# Patient Record
Sex: Male | Born: 1995 | Race: White | Hispanic: No | Smoking: Never smoker
Health system: Southern US, Community
[De-identification: ages and names within clinical notes are randomized; demographics above are authoritative.]

## PROBLEM LIST (undated history)

## (undated) HISTORY — PX: EXTERNAL EAR SURGERY: SHX627

## (undated) HISTORY — PX: FOOT SURGERY: SHX648

---
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
## 2017-12-15 ENCOUNTER — Encounter: Payer: Self-pay | Admitting: Emergency Medicine

## 2017-12-15 ENCOUNTER — Other Ambulatory Visit: Payer: Self-pay

## 2017-12-15 ENCOUNTER — Emergency Department
Admission: EM | Admit: 2017-12-15 | Discharge: 2017-12-15 | Disposition: A | Discharge status: Home / Self Care | Payer: Managed Care, Other (non HMO) | Attending: Emergency Medicine | Admitting: Emergency Medicine

## 2017-12-15 DIAGNOSIS — Y93G1 Activity, food preparation and clean up: Secondary | ICD-10-CM | POA: Insufficient documentation

## 2017-12-15 DIAGNOSIS — Y998 Other external cause status: Secondary | ICD-10-CM | POA: Diagnosis not present

## 2017-12-15 DIAGNOSIS — Y929 Unspecified place or not applicable: Secondary | ICD-10-CM | POA: Insufficient documentation

## 2017-12-15 DIAGNOSIS — S61012A Laceration without foreign body of left thumb without damage to nail, initial encounter: Secondary | ICD-10-CM

## 2017-12-15 DIAGNOSIS — W260XXA Contact with knife, initial encounter: Secondary | ICD-10-CM | POA: Diagnosis not present

## 2017-12-15 DIAGNOSIS — S61011A Laceration without foreign body of right thumb without damage to nail, initial encounter: Secondary | ICD-10-CM | POA: Diagnosis present

## 2017-12-15 NOTE — ED Notes (Addendum)
Pt presents to ED with c/o right thumb laceration that happened approx 2 hrs PTA while cutting vegetables. No bleeding noted upon arrival. Pt states last tetanus within a year.

## 2017-12-15 NOTE — ED Notes (Signed)
Allayne StackJann, EDT to apply right hand splint

## 2017-12-15 NOTE — ED Provider Notes (Signed)
[facility] Emergency Department Provider Note  ____________________________________________  Time seen: Approximately 9:51 PM  I have reviewed the triage vital signs and the nursing notes.   HISTORY  Chief Complaint Laceration    HPI Ronald Freeman is a 22 y.o. male presents to the emergency department with a right thumb laceration.  Patient reports that he sustained laceration with a clean knife while cutting vegetables.  Tetanus status is up-to-date.  He denies weakness, radiculopathy or changes in sensation of the upper extremities.  No alleviating measures of been attempted.   History reviewed. No pertinent past medical history.  There are no active problems to display for this patient.   Past Surgical History:  Procedure Laterality Date  . EXTERNAL EAR SURGERY Left    tumor removal  . FOOT SURGERY Right     Prior to Admission medications   Medication Sig Start Date End Date Taking? Authorizing Provider  cetirizine (ZYRTEC) 10 MG tablet Take 10 mg by mouth as needed for allergies.    [provider]  fexofenadine (ALLEGRA) 180 MG tablet Take 180 mg by mouth as needed for allergies or rhinitis.    [provider]  fluticasone (FLONASE) 50 MCG/ACT nasal spray Place into both nostrils as needed for allergies or rhinitis.    [provider]  pseudoephedrine (SUDAFED) 120 MG 12 hr tablet Take 120 mg by mouth daily as needed for congestion.    [provider]    Allergies Patient has no known allergies.  No family history on file.  Social History Social History   Tobacco Use  . Smoking status: Never Smoker  . Smokeless tobacco: Never Used  Substance Use Topics  . Alcohol use: No  . Drug use: Not on file     Review of Systems  Constitutional: No fever/chills Eyes: No visual changes. No discharge ENT: No upper respiratory complaints. Cardiovascular: no chest pain. Respiratory: no cough. No  SOB. Gastrointestinal: No abdominal pain.  No nausea, no vomiting.  No diarrhea.  No constipation. Musculoskeletal: Negative for musculoskeletal pain. Skin: Patient has laceration of right thumb. Neurological: Negative for headaches, focal weakness or numbness. .  ____________________________________________   PHYSICAL EXAM:  VITAL SIGNS: ED Triage Vitals  Enc Vitals Group     BP 12/15/17 1933 125/78     Pulse Rate 12/15/17 1933 76     Resp 12/15/17 1933 18     Temp 12/15/17 1933 98 F (36.7 C)     Temp Source 12/15/17 1933 Oral     SpO2 12/15/17 1933 97 %     Weight 12/15/17 1931 265 lb (120.2 kg)     Height 12/15/17 1931 5\' 8"  (1.727 m)     Head Circumference --      Peak Flow --      Pain Score 12/15/17 1931 0     Pain Loc --      Pain Edu? --      Excl. in GC? --      Constitutional: Alert and oriented. Well appearing and in no acute distress. Eyes: Conjunctivae are normal. PERRL. EOMI. Head: Atraumatic. Cardiovascular: Normal rate, regular rhythm. Normal S1 and S2.  Good peripheral circulation. Respiratory: Normal respiratory effort without tachypnea or retractions. Lungs CTAB. Good air entry to the bases with no decreased or absent breath sounds. Musculoskeletal: Full range of motion to all extremities. No gross deformities appreciated. Neurologic:  Normal speech and language. No gross focal neurologic deficits are appreciated.  Skin: Patient  has 1 cm laceration sustained to her right thumb. Psychiatric: Mood and affect are normal. Speech and behavior are normal. Patient exhibits appropriate insight and judgement.   ____________________________________________   LABS (all labs ordered are listed, but only abnormal results are displayed)  Labs Reviewed - No data to display ____________________________________________  EKG   ____________________________________________  RADIOLOGY   No results  found.  ____________________________________________    PROCEDURES  Procedure(s) performed:    Procedures  LACERATION REPAIR Performed by: Orvil Feil Authorized by: Orvil Feil Consent: Verbal consent obtained. Risks and benefits: risks, benefits and alternatives were discussed Consent given by: patient Patient identity confirmed: provided demographic data Prepped and Draped in normal sterile fashion Wound explored  Laceration Location: Right thumb  Laceration Length: 1 cm  No Foreign Bodies seen or palpated  Skin closure: Dermabond  Patient tolerance: Patient tolerated the procedure well with no immediate complications.   Medications - No data to display   ____________________________________________   INITIAL IMPRESSION / ASSESSMENT AND PLAN / ED COURSE  Pertinent labs & imaging results that were available during my care of the patient were reviewed by me and considered in my medical decision making (see chart for details).  Review of the [place] CSRS was performed in accordance of the [facility] prior to dispensing any controlled drugs.     Assessment and plan Right thumb laceration Patient presents to the emergency department with a laceration along the right thumb repaired in the emergency department with Dermabond.  Patient was advised to follow-up with primary care as needed.  All patient questions were answered.    ____________________________________________  FINAL CLINICAL IMPRESSION(S) / ED DIAGNOSES  Final diagnoses:  Laceration of left thumb without foreign body without damage to nail, initial encounter      NEW MEDICATIONS STARTED DURING THIS VISIT:  ED Discharge Orders    None          This chart was dictated using voice recognition software/Dragon. Despite best efforts to proofread, errors can occur which can change the meaning. Any change was purely unintentional.    Orvil Feil, PA-C 12/15/17 2154    Jeanmarie Plant, MD 12/15/17 [phone]

## 2017-12-15 NOTE — ED Triage Notes (Addendum)
Patient ambulatory to triage with steady gait, without difficulty or distress noted; pt reports cutting right thumb 2hrs PTA while cutting produce with a knife; approx 1" lac noted with scant bleeding

## 2018-01-21 ENCOUNTER — Emergency Department
Admission: EM | Admit: 2018-01-21 | Discharge: 2018-01-21 | Disposition: A | Discharge status: Home / Self Care | Payer: Managed Care, Other (non HMO) | Attending: Emergency Medicine | Admitting: Emergency Medicine

## 2018-01-21 ENCOUNTER — Encounter: Payer: Self-pay | Admitting: Emergency Medicine

## 2018-01-21 DIAGNOSIS — H538 Other visual disturbances: Secondary | ICD-10-CM | POA: Insufficient documentation

## 2018-01-21 DIAGNOSIS — H81392 Other peripheral vertigo, left ear: Secondary | ICD-10-CM | POA: Insufficient documentation

## 2018-01-21 DIAGNOSIS — R42 Dizziness and giddiness: Secondary | ICD-10-CM | POA: Diagnosis not present

## 2018-01-21 DIAGNOSIS — R112 Nausea with vomiting, unspecified: Secondary | ICD-10-CM | POA: Insufficient documentation

## 2018-01-21 DIAGNOSIS — R51 Headache: Secondary | ICD-10-CM | POA: Diagnosis present

## 2018-01-21 LAB — URINALYSIS, COMPLETE (UACMP) WITH MICROSCOPIC
BACTERIA UA: NONE SEEN
Bilirubin Urine: NEGATIVE
GLUCOSE, UA: NEGATIVE mg/dL
HGB URINE DIPSTICK: NEGATIVE
KETONES UR: NEGATIVE mg/dL
LEUKOCYTES UA: NEGATIVE
NITRITE: NEGATIVE
PROTEIN: NEGATIVE mg/dL
Specific Gravity, Urine: 1.026 (ref 1.005–1.030)
pH: 6 (ref 5.0–8.0)

## 2018-01-21 LAB — BASIC METABOLIC PANEL
ANION GAP: 8 (ref 5–15)
BUN: 14 mg/dL (ref 6–20)
CO2: 23 mmol/L (ref 22–32)
Calcium: 9.1 mg/dL (ref 8.9–10.3)
Chloride: 107 mmol/L (ref 101–111)
Creatinine, Ser: 0.77 mg/dL (ref 0.61–1.24)
Glucose, Bld: 97 mg/dL (ref 65–99)
Potassium: 4.1 mmol/L (ref 3.5–5.1)
SODIUM: 138 mmol/L (ref 135–145)

## 2018-01-21 LAB — CBC
HEMATOCRIT: 47.7 % (ref 40.0–52.0)
HEMOGLOBIN: 16.5 g/dL (ref 13.0–18.0)
MCH: 29.8 pg (ref 26.0–34.0)
MCHC: 34.6 g/dL (ref 32.0–36.0)
MCV: 86.2 fL (ref 80.0–100.0)
Platelets: 254 10*3/uL (ref 150–440)
RBC: 5.54 MIL/uL (ref 4.40–5.90)
RDW: 12.5 % (ref 11.5–14.5)
WBC: 8 10*3/uL (ref 3.8–10.6)

## 2018-01-21 MED ORDER — MECLIZINE HCL 25 MG PO TABS: 25.0000 mg | ORAL_TABLET | Freq: Three times a day (TID) | ORAL | 0 refills | Status: AC | PRN

## 2018-01-21 MED ORDER — MECLIZINE HCL 25 MG PO TABS
50.0000 mg | ORAL_TABLET | Freq: Once | ORAL | Status: AC
  Administered 2018-01-21: 50 mg via ORAL
  Filled 2018-01-21: qty 2

## 2018-01-21 MED ORDER — ONDANSETRON 4 MG PO TBDP: 4.0000 mg | ORAL_TABLET | Freq: Three times a day (TID) | ORAL | 0 refills | Status: AC | PRN

## 2018-01-21 MED ORDER — ONDANSETRON 4 MG PO TBDP
4.0000 mg | ORAL_TABLET | Freq: Once | ORAL | Status: AC
  Administered 2018-01-21: 4 mg via ORAL
  Filled 2018-01-21: qty 1

## 2018-01-21 NOTE — ED Triage Notes (Signed)
FIRST NURSE NOTE-nausea and vomiting with dizziness. From [facility].  Has not kept anything down for 14 hrs per pt.  Alert, in wheelchair. NAD.

## 2018-01-21 NOTE — ED Triage Notes (Signed)
Patient presents to the ED with headache, nausea and vomiting since patient woke up this morning.  Patient states as soon as a sat up in bed I started feeling terrible.  Patient states, "the floor is spinning."  Patient states dizziness is much worse when he closes his eyes.  Patient denies history of vertigo.

## 2018-01-21 NOTE — ED Notes (Signed)
Patient given a couple of packets of saltines per mother's request. Patient has tolerated sips of Mountain Dew at this time.

## 2018-01-21 NOTE — ED Provider Notes (Signed)
[facility] Emergency Department Provider Note  ____________________________________________   First MD Initiated Contact with Patient 01/21/18 1633     (approximate)  I have reviewed the triage vital signs and the nursing notes.   HISTORY  Chief Complaint Emesis and Dizziness   HPI Ronald Freeman is a 22 y.o. male who self presents to the emergency department from [facility] with headache, nausea, vomiting, and vertigo that began this morning.  Symptoms began when he woke up and rolled over in bed and were severe.  He said "the floor is spinning".  His symptoms are worse when moving and turning his head and improved when lying still.  Associated with nausea and blurred vision.  This is never happened before.  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  He denies chest pain, shortness of breath, numbness, weakness, slurred speech.  He has no recent illness.  History reviewed. No pertinent past medical history.  There are no active problems to display for this patient.   Past Surgical History:  Procedure Laterality Date  . EXTERNAL EAR SURGERY Left    tumor removal  . FOOT SURGERY Right     Prior to Admission medications   Medication Sig Start Date End Date Taking? Authorizing Provider  cetirizine (ZYRTEC) 10 MG tablet Take 10 mg by mouth as needed for allergies.    [provider]  fexofenadine (ALLEGRA) 180 MG tablet Take 180 mg by mouth as needed for allergies or rhinitis.    [provider]  fluticasone (FLONASE) 50 MCG/ACT nasal spray Place into both nostrils as needed for allergies or rhinitis.    [provider]  meclizine (ANTIVERT) 25 MG tablet Take 1 tablet (25 mg total) by mouth 3 (three) times daily as needed for dizziness. 01/21/18   Merrily Brittle, MD  ondansetron (ZOFRAN ODT) 4 MG disintegrating tablet Take 1 tablet (4 mg total) by mouth every 8  (eight) hours as needed for nausea or vomiting. 01/21/18   Merrily Brittle, MD  pseudoephedrine (SUDAFED) 120 MG 12 hr tablet Take 120 mg by mouth daily as needed for congestion.    [provider]    Allergies Patient has no known allergies.  No family history on file.  Social History Social History   Tobacco Use  . Smoking status: Never Smoker  . Smokeless tobacco: Never Used  Substance Use Topics  . Alcohol use: Yes    Comment: 1 drink/ month  . Drug use: Not on file    Review of Systems Constitutional: No fever/chills Eyes: Positive for ENT: No sore throat. Cardiovascular: Denies chest pain. Respiratory: Denies shortness of breath. Gastrointestinal: No abdominal pain.  Positive for nausea, positive for vomiting.  No diarrhea.  No constipation. Genitourinary: Negative for dysuria. Musculoskeletal: Negative for back pain. Skin: Negative for bruise. Neurological: Positive for headache   ____________________________________________   PHYSICAL EXAM:  VITAL SIGNS: ED Triage Vitals  Enc Vitals Group     BP 01/21/18 1326 108/79     Pulse Rate 01/21/18 1326 67     Resp 01/21/18 1326 18     Temp 01/21/18 1326 98.2 F (36.8 C)     Temp Source 01/21/18 1326 Oral     SpO2 01/21/18 1326 98 %     Weight 01/21/18 1326 265 lb (120.2 kg)     Height 01/21/18 1326  (1.753 m)     Head Circumference --  Peak Flow --      Pain Score 01/21/18 1350 0     Pain Loc --      Pain Edu? --      Excl. in GC? --     Constitutional: Alert and oriented x4 appears somewhat uncomfortable nontoxic no diaphoresis speaks in full clear sentences Eyes: PERRL EOMI. nystagmus on leftward gaze fatigable.  No vertical or rotational nystagmus Head: Atraumatic.  Right tympanic membrane normal left tympanic membrane normal Nose: No congestion/rhinnorhea. Mouth/Throat: No trismus Neck: No stridor.   Cardiovascular: Normal rate, regular rhythm. Grossly normal heart sounds.  Good  peripheral circulation. Respiratory: Normal respiratory effort.  No retractions. Lungs CTAB and moving good air Gastrointestinal: Soft nontender Musculoskeletal: No lower extremity edema   Neurologic:  Normal speech and language. N No pronator drift.  Normal finger-nose-finger.  No truncal ataxia Skin:  Skin is warm, dry and intact. No rash noted. Psychiatric: Mood and affect are normal. Speech and behavior are normal.    ____________________________________________   DIFFERENTIAL includes but not limited to  Central vertigo, peripheral vertigo, otitis media ____________________________________________   LABS (all labs ordered are listed, but only abnormal results are displayed)  Labs Reviewed  URINALYSIS, COMPLETE (UACMP) WITH MICROSCOPIC - Abnormal; Notable for the following components:      Result Value   Color, Urine YELLOW (*)    APPearance HAZY (*)    All other components within normal limits  BASIC METABOLIC PANEL  CBC    Lab work reviewed by me with no acute disease __________________________________________  EKG  ED ECG REPORT I, Merrily Brittle, the attending physician, personally viewed and interpreted this ECG.  Date: 01/21/2018 EKG Time:  Rate: 67 Rhythm: normal sinus rhythm QRS Axis: normal Intervals: normal ST/T Wave abnormalities: normal Narrative Interpretation: no evidence of acute ischemia  ____________________________________________  RADIOLOGY   ____________________________________________   PROCEDURES  Procedure(s) performed: no  Procedures  Critical Care performed: no  Observation: no ____________________________________________   INITIAL IMPRESSION / ASSESSMENT AND PLAN / ED COURSE  Pertinent labs & imaging results that were available during my care of the patient were reviewed by me and considered in my medical decision making (see chart for details).  By the time I saw the patient he had already meclizine and Zofran and  his symptoms were significantly improved.  He has brief discrete episodes of room spinning vertigo associated with movement which are most consistent with BPPV.  He does have fatigable leftward beating nystagmus on exam.  No other neuro symptoms to suggest central etiology.  I demonstrated the Epley maneuver to the patient with minimal improvement I have encouraged him to continue at home.  He does follow-up with Dr. 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  He is discharged home in improved condition verbalizes understanding agreement the plan.      ____________________________________________   FINAL CLINICAL IMPRESSION(S) / ED DIAGNOSES  Final diagnoses:  Peripheral vertigo involving left ear      NEW MEDICATIONS STARTED DURING THIS VISIT:  Discharge Medication List as of 01/21/2018  4:54 PM    START taking these medications   Details  meclizine (ANTIVERT) 25 MG tablet Take 1 tablet (25 mg total) by mouth 3 (three) times daily as needed for dizziness., Starting Sat 01/21/2018, Print    ondansetron (ZOFRAN ODT) 4 MG disintegrating tablet Take 1 tablet (4 mg total) by mouth every 8 (eight) hours as needed  for nausea or vomiting., Starting Sat 01/21/2018, Print         Note:  This document was prepared using Dragon voice recognition software and may include unintentional dictation errors.     Merrily Brittle, MD 01/22/18 1354

## 2018-01-21 NOTE — Discharge Instructions (Signed)
Please perform the Epley maneuver 5-10 times a day towards the left until you feel improved.  Follow-up with Dr. Jenne Campus if your symptoms do not resolve in the next day or 2.  Return to the emergency department sooner for any concerns whatsoever.  It was a pleasure to take care of you today, and thank you for coming to our emergency department.  If you have any questions or concerns before leaving please ask the nurse to grab me and I'm more than happy to go through your aftercare instructions again.  If you were prescribed any opioid pain medication today such as Norco, Vicodin, Percocet, morphine, hydrocodone, or oxycodone please make sure you do not drive when you are taking this medication as it can alter your ability to drive safely.  If you have any concerns once you are home that you are not improving or are in fact getting worse before you can make it to your follow-up appointment, please do not hesitate to call [phone] and come back for further evaluation.  Merrily Brittle, MD  Results for orders placed or performed during the hospital encounter of 01/21/18  Basic metabolic panel  Result Value Ref Range   Sodium 138 135 - 145 mmol/L   Potassium 4.1 3.5 - 5.1 mmol/L   Chloride 107 101 - 111 mmol/L   CO2 23 22 - 32 mmol/L   Glucose, Bld 97 65 - 99 mg/dL   BUN 14 6 - 20 mg/dL   Creatinine, Ser 2.95 0.61 - 1.24 mg/dL   Calcium 9.1 8.9 - 62.1 mg/dL   GFR calc non Af Amer >60 >60 mL/min   GFR calc Af Amer >60 >60 mL/min   Anion gap 8 5 - 15  CBC  Result Value Ref Range   WBC 8.0 3.8 - 10.6 K/uL   RBC 5.54 4.40 - 5.90 MIL/uL   Hemoglobin 16.5 13.0 - 18.0 g/dL   HCT 30.8 65.7 - 84.6 %   MCV 86.2 80.0 - 100.0 fL   MCH 29.8 26.0 - 34.0 pg   MCHC 34.6 32.0 - 36.0 g/dL   RDW 96.2 95.2 - 84.1 %   Platelets 254 150 - 440 K/uL  Urinalysis, Complete w Microscopic  Result Value Ref Range   Color, Urine YELLOW (A) YELLOW   APPearance HAZY (A) CLEAR   Specific Gravity, Urine 1.026 1.005 -  1.030   pH 6.0 5.0 - 8.0   Glucose, UA NEGATIVE NEGATIVE mg/dL   Hgb urine dipstick NEGATIVE NEGATIVE   Bilirubin Urine NEGATIVE NEGATIVE   Ketones, ur NEGATIVE NEGATIVE mg/dL   Protein, ur NEGATIVE NEGATIVE mg/dL   Nitrite NEGATIVE NEGATIVE   Leukocytes, UA NEGATIVE NEGATIVE   RBC / HPF 0-5 0 - 5 RBC/hpf   WBC, UA 6-10 0 - 5 WBC/hpf   Bacteria, UA NONE SEEN NONE SEEN   Squamous Epithelial / LPF 0-5 0 - 5   Mucus PRESENT    Hyaline Casts, UA PRESENT

## 2018-01-21 NOTE — ED Triage Notes (Signed)
Patient also has a rash to his left wrist and abdomen that he has been treating with hydrocortisone cream.

## 2018-07-05 ENCOUNTER — Other Ambulatory Visit: Payer: Self-pay

## 2018-07-05 ENCOUNTER — Emergency Department: Payer: Managed Care, Other (non HMO)

## 2018-07-05 ENCOUNTER — Emergency Department
Admission: EM | Admit: 2018-07-05 | Discharge: 2018-07-05 | Disposition: A | Discharge status: Home / Self Care | Payer: Managed Care, Other (non HMO) | Attending: Emergency Medicine | Admitting: Emergency Medicine

## 2018-07-05 ENCOUNTER — Encounter: Payer: Self-pay | Admitting: Emergency Medicine

## 2018-07-05 DIAGNOSIS — Z79899 Other long term (current) drug therapy: Secondary | ICD-10-CM | POA: Diagnosis not present

## 2018-07-05 DIAGNOSIS — Y999 Unspecified external cause status: Secondary | ICD-10-CM | POA: Diagnosis not present

## 2018-07-05 DIAGNOSIS — S93401A Sprain of unspecified ligament of right ankle, initial encounter: Secondary | ICD-10-CM | POA: Insufficient documentation

## 2018-07-05 DIAGNOSIS — X509XXA Other and unspecified overexertion or strenuous movements or postures, initial encounter: Secondary | ICD-10-CM | POA: Insufficient documentation

## 2018-07-05 DIAGNOSIS — Y929 Unspecified place or not applicable: Secondary | ICD-10-CM | POA: Insufficient documentation

## 2018-07-05 DIAGNOSIS — Y9367 Activity, basketball: Secondary | ICD-10-CM | POA: Diagnosis not present

## 2018-07-05 DIAGNOSIS — S99911A Unspecified injury of right ankle, initial encounter: Secondary | ICD-10-CM | POA: Diagnosis present

## 2018-07-05 MED ORDER — IBUPROFEN 800 MG PO TABS
800.0000 mg | ORAL_TABLET | Freq: Once | ORAL | Status: AC
  Administered 2018-07-05: 800 mg via ORAL
  Filled 2018-07-05: qty 1

## 2018-07-05 MED ORDER — MELOXICAM 15 MG PO TABS: 15.0000 mg | ORAL_TABLET | Freq: Every day | ORAL | 2 refills | Status: AC

## 2018-07-05 NOTE — ED Triage Notes (Signed)
Pt c/o RT ankle pain after fall playing basketball today. + swelling, non weightbearing. PT states he heard pop with fall. VSS

## 2018-07-05 NOTE — Discharge Instructions (Signed)
Follow-up with Dr. Rosita Kea.  Please call for an appointment.  Keep the ankle elevated and iced.  Use crutches.  If you are able to bear weight without pain you do not need to use crutches.

## 2018-07-05 NOTE — ED Notes (Signed)
Patient is complaining of ankle pain since rolling his ankle while playing basketball around 3:30pm.  Patient states the pain has been so severe he has vomited x 2.  Patient states he cannot bear any weight on his right ankle.  Sensation and mobility to foot is intact.

## 2018-07-05 NOTE — ED Provider Notes (Signed)
[facility] Emergency Department Provider Note  ____________________________________________   First MD Initiated Contact with Patient 07/05/18 1740     (approximate)  I have reviewed the triage vital signs and the nursing notes.   HISTORY  Chief Complaint Ankle Pain    HPI Ronald Freeman is a 22 y.o. male presents emergency department complaining of right ankle pain.  States he was playing basketball came down on the ankle rolling it to the side.  He states he is vomited 3 times due to the pain.  He states he cannot bear weight without difficulty.  He states there is a lot of swelling on the outside of the ankle and he heard several pops.  He denies numbness or tingling.  Denies any other injuries at this time.  He is a Consulting civil engineer at [employer].  He commutes from home.    History reviewed. No pertinent past medical history.  There are no active problems to display for this patient.   Past Surgical History:  Procedure Laterality Date  . EXTERNAL EAR SURGERY Left    tumor removal  . FOOT SURGERY Right     Prior to Admission medications   Medication Sig Start Date End Date Taking? Authorizing Provider  cetirizine (ZYRTEC) 10 MG tablet Take 10 mg by mouth as needed for allergies.    [provider]  fexofenadine (ALLEGRA) 180 MG tablet Take 180 mg by mouth as needed for allergies or rhinitis.    [provider]  fluticasone (FLONASE) 50 MCG/ACT nasal spray Place into both nostrils as needed for allergies or rhinitis.    [provider]  meclizine (ANTIVERT) 25 MG tablet Take 1 tablet (25 mg total) by mouth 3 (three) times daily as needed for dizziness. 01/21/18   Merrily Brittle, MD  meloxicam (MOBIC) 15 MG tablet Take 1 tablet (15 mg total) by mouth daily. 07/05/18 07/05/19  Sherrie Mustache, Roselyn Bering, PA-C  ondansetron (ZOFRAN ODT) 4 MG disintegrating tablet Take 1 tablet (4 mg total) by mouth every 8 (eight) hours as needed for nausea or  vomiting. 01/21/18   Merrily Brittle, MD  pseudoephedrine (SUDAFED) 120 MG 12 hr tablet Take 120 mg by mouth daily as needed for congestion.    [provider]    Allergies Patient has no known allergies.  No family history on file.  Social History Social History   Tobacco Use  . Smoking status: Never Smoker  . Smokeless tobacco: Never Used  Substance Use Topics  . Alcohol use: Yes    Comment: 1 drink/ month  . Drug use: Not on file    Review of Systems  Constitutional: No fever/chills Eyes: No visual changes. ENT: No sore throat. Respiratory: Denies cough Genitourinary: Negative for dysuria. Musculoskeletal: Negative for back pain.  Positive for right ankle pain  skin: Negative for rash.    ____________________________________________   PHYSICAL EXAM:  VITAL SIGNS: ED Triage Vitals  Enc Vitals Group     BP 07/05/18 1630 (!) 122/92     Pulse Rate 07/05/18 1630 90     Resp 07/05/18 1630 16     Temp 07/05/18 1630 98.2 F (36.8 C)     Temp Source 07/05/18 1630 Oral     SpO2 07/05/18 1630 100 %     Weight 07/05/18 1631 300 lb (136.1 kg)     Height 07/05/18 1631 5\' 9"  (1.753 m)     Head Circumference --      Peak Flow --  Pain Score 07/05/18 1630 6     Pain Loc --      Pain Edu? --      Excl. in GC? --     Constitutional: Alert and oriented. Well appearing and in no acute distress. Eyes: Conjunctivae are normal.  Head: Atraumatic. Nose: No congestion/rhinnorhea. Mouth/Throat: Mucous membranes are moist.   Neck:  supple no lymphadenopathy noted Cardiovascular: Normal rate, regular rhythm.  Respiratory: Normal respiratory effort.  No retractions, GU: deferred Musculoskeletal: Decreased range of motion of the right ankle secondary to discomfort.  The lateral malleolus is tender and swollen.  The knee and foot are not tender.  Neurovascular is intact.  Neurologic:  Normal speech and language.  Skin:  Skin is warm, dry and intact. No rash  noted. Psychiatric: Mood and affect are normal. Speech and behavior are normal.  ____________________________________________   LABS (all labs ordered are listed, but only abnormal results are displayed)  Labs Reviewed - No data to display ____________________________________________   ____________________________________________  RADIOLOGY  X-ray of the right ankle is negative for fracture  ____________________________________________   PROCEDURES  Procedure(s) performed: Ace wrap, stirrup splint, crutches were applied by the tech  Procedures    ____________________________________________   INITIAL IMPRESSION / ASSESSMENT AND PLAN / ED COURSE  Pertinent labs & imaging results that were available during my care of the patient were reviewed by me and considered in my medical decision making (see chart for details).   Patient is 22 year old male presents emergency department complaint right ankle pain after rolling it while playing basketball.   On physical exam the right ankle is tender and swollen at the lateral malleolus.  X-ray of the right ankle is negative for fracture  Explained the x-ray findings to the patient is mother.  Patient was placed in Ace wrap and stirrup splint.  He was given crutches.  He is to follow-up with orthopedics due to the decreased range of motion of the foot and ankle.  Most of this is most likely due to discomfort.  He was given ibuprofen while here in the ED.  He is to follow-up with [facility] clinic orthopedics.  He states he understands will comply.  He was given a note for school stating that he should have extra time to walk in between classes.  Restrictions for 6 weeks as it may take 6 weeks for the sprain to heal.  He was discharged in stable condition in the care of his mother.     As part of my medical decision making, I reviewed the following data within the electronic MEDICAL RECORD NUMBER History obtained from family, Old chart  reviewed, Radiograph reviewed x-ray of the right ankle is negative for fracture, Notes from prior ED visits and [place] Controlled Substance Database  ____________________________________________   FINAL CLINICAL IMPRESSION(S) / ED DIAGNOSES  Final diagnoses:  Sprain of right ankle, unspecified ligament, initial encounter      NEW MEDICATIONS STARTED DURING THIS VISIT:  New Prescriptions   MELOXICAM (MOBIC) 15 MG TABLET    Take 1 tablet (15 mg total) by mouth daily.     Note:  This document was prepared using Dragon voice recognition software and may include unintentional dictation errors.    Faythe Ghee, PA-C 07/05/18 1830    Arnaldo Natal, MD 07/05/18 [phone]

## 2019-06-18 ENCOUNTER — Other Ambulatory Visit: Payer: Self-pay

## 2019-06-18 ENCOUNTER — Ambulatory Visit (LOCAL_COMMUNITY_HEALTH_CENTER): Payer: Managed Care, Other (non HMO)

## 2019-06-18 DIAGNOSIS — Z23 Encounter for immunization: Secondary | ICD-10-CM | POA: Diagnosis not present

## 2020-06-14 DIAGNOSIS — Z23 Encounter for immunization: Secondary | ICD-10-CM

## 2020-06-16 ENCOUNTER — Ambulatory Visit: Payer: Self-pay

## 2020-06-16 ENCOUNTER — Ambulatory Visit (LOCAL_COMMUNITY_HEALTH_CENTER): Payer: Self-pay

## 2020-06-16 ENCOUNTER — Other Ambulatory Visit: Payer: Self-pay

## 2020-06-16 DIAGNOSIS — Z23 Encounter for immunization: Secondary | ICD-10-CM

## 2021-05-25 ENCOUNTER — Ambulatory Visit (INDEPENDENT_AMBULATORY_CARE_PROVIDER_SITE_OTHER): Payer: Managed Care, Other (non HMO) | Admitting: Dermatology

## 2021-05-25 ENCOUNTER — Other Ambulatory Visit: Payer: Self-pay

## 2021-05-25 DIAGNOSIS — L720 Epidermal cyst: Secondary | ICD-10-CM

## 2021-05-25 MED ORDER — TAZAROTENE 0.1 % EX CREA: TOPICAL_CREAM | Freq: Every day | CUTANEOUS | 2 refills | Status: AC

## 2021-05-25 NOTE — Progress Notes (Signed)
   New Patient Visit  Subjective  Ronald Freeman is a 25 y.o. male who presents for the following: Other (Milia around eyes x several years and has had some more popping up).  The following portions of the chart were reviewed this encounter and updated as appropriate:   Tobacco  Allergies  Meds  Problems  Med Hx  Surg Hx  Fam Hx     Review of Systems:  No other skin or systemic complaints except as noted in HPI or Assessment and Plan.  Objective  Well appearing patient in no apparent distress; mood and affect are within normal limits.  A focused examination was performed including face. Relevant physical exam findings are noted in the Assessment and Plan.  Left upper eyelid, left lat canthus Smooth white papule(s).    Assessment & Plan  Milia of face Left upper eyelid, left lat canthus  Start Tazorac 0.1% cream qhs  tazarotene (TAZORAC) 0.1 % cream - Left upper eyelid, left lat canthus Apply topically at bedtime.  Acne/Milia surgery - Left upper eyelid, left lat canthus Procedure risks and benefits were discussed with the patient and verbal consent was obtained. Following prep of the skin with an alcohol swab and  anesthesia: 2% lidocaine with epi on the left upper eyelid and left lat canthus, extraction of milia was performed with cotton tip applicators following superficial incision made over their surfaces with a #11 surgical blade. Capillary hemostasis was achieved with 20% aluminum chloride solution. Vaseline ointment was applied to each site. The patient tolerated the procedure well.  Return if symptoms worsen or fail to improve.  I, Joanie Coddington, CMA, am acting as scribe for Armida Sans, MD .  Documentation: I have reviewed the above documentation for accuracy and completeness, and I agree with the above.  Armida Sans, MD

## 2021-05-25 NOTE — Patient Instructions (Signed)

## 2021-05-27 ENCOUNTER — Encounter: Payer: Self-pay | Admitting: Dermatology

## 2021-07-27 ENCOUNTER — Other Ambulatory Visit: Payer: Self-pay

## 2021-07-27 ENCOUNTER — Ambulatory Visit (LOCAL_COMMUNITY_HEALTH_CENTER): Payer: Managed Care, Other (non HMO)

## 2021-07-27 DIAGNOSIS — Z23 Encounter for immunization: Secondary | ICD-10-CM
